# Patient Record
Sex: Male | Born: 1980 | Race: White | Hispanic: No | Marital: Married | Smoking: Current every day smoker
Health system: Southern US, Community
[De-identification: ages and names within clinical notes are randomized; demographics above are authoritative.]

---

## 2002-04-21 ENCOUNTER — Emergency Department (HOSPITAL_COMMUNITY): Admission: EM | Admit: 2002-04-21 | Discharge: 2002-04-21 | Payer: Self-pay | Admitting: *Deleted

## 2009-02-19 ENCOUNTER — Emergency Department (HOSPITAL_COMMUNITY): Admission: EM | Admit: 2009-02-19 | Discharge: 2009-02-19 | Payer: Self-pay | Admitting: Emergency Medicine

## 2009-02-22 ENCOUNTER — Emergency Department (HOSPITAL_COMMUNITY): Admission: EM | Admit: 2009-02-22 | Discharge: 2009-02-22 | Payer: Self-pay | Admitting: Emergency Medicine

## 2015-01-11 ENCOUNTER — Emergency Department (HOSPITAL_COMMUNITY): Payer: BLUE CROSS/BLUE SHIELD

## 2015-01-11 ENCOUNTER — Emergency Department (HOSPITAL_COMMUNITY)
Admission: EM | Admit: 2015-01-11 | Discharge: 2015-01-11 | Disposition: A | Discharge status: Home / Self Care | Payer: BLUE CROSS/BLUE SHIELD | Attending: Emergency Medicine | Admitting: Emergency Medicine

## 2015-01-11 ENCOUNTER — Encounter (HOSPITAL_COMMUNITY): Payer: Self-pay | Admitting: Family Medicine

## 2015-01-11 DIAGNOSIS — R079 Chest pain, unspecified: Secondary | ICD-10-CM | POA: Diagnosis present

## 2015-01-11 DIAGNOSIS — R05 Cough: Secondary | ICD-10-CM

## 2015-01-11 DIAGNOSIS — R0789 Other chest pain: Secondary | ICD-10-CM | POA: Diagnosis not present

## 2015-01-11 DIAGNOSIS — Z720 Tobacco use: Secondary | ICD-10-CM | POA: Insufficient documentation

## 2015-01-11 DIAGNOSIS — R059 Cough, unspecified: Secondary | ICD-10-CM

## 2015-01-11 LAB — CBC
HCT: 45.7 % (ref 39.0–52.0)
Hemoglobin: 15.6 g/dL (ref 13.0–17.0)
MCH: 30.2 pg (ref 26.0–34.0)
MCHC: 34.1 g/dL (ref 30.0–36.0)
MCV: 88.6 fL (ref 78.0–100.0)
PLATELETS: 262 10*3/uL (ref 150–400)
RBC: 5.16 MIL/uL (ref 4.22–5.81)
RDW: 13.2 % (ref 11.5–15.5)
WBC: 6.2 10*3/uL (ref 4.0–10.5)

## 2015-01-11 LAB — BASIC METABOLIC PANEL
ANION GAP: 7 (ref 5–15)
BUN: 20 mg/dL (ref 6–20)
CALCIUM: 9.6 mg/dL (ref 8.9–10.3)
CO2: 26 mmol/L (ref 22–32)
Chloride: 106 mmol/L (ref 101–111)
Creatinine, Ser: 1.41 mg/dL — ABNORMAL HIGH (ref 0.61–1.24)
GFR calc Af Amer: >60 mL/min (ref >60)
GFR calc non Af Amer: >60 mL/min (ref >60)
Glucose, Bld: 99 mg/dL (ref 65–99)
POTASSIUM: 4.3 mmol/L (ref 3.5–5.1)
SODIUM: 139 mmol/L (ref 135–145)

## 2015-01-11 LAB — I-STAT TROPONIN, ED
Troponin i, poc: 0.01 ng/mL (ref 0.00–0.08)
Troponin i, poc: 0.02 ng/mL (ref 0.00–0.08)

## 2015-01-11 MED ORDER — IPRATROPIUM BROMIDE 0.02 % IN SOLN
0.5000 mg | Freq: Once | RESPIRATORY_TRACT | Status: AC
  Administered 2015-01-11: 0.5 mg via RESPIRATORY_TRACT
  Filled 2015-01-11: qty 2.5

## 2015-01-11 MED ORDER — ALBUTEROL SULFATE HFA 108 (90 BASE) MCG/ACT IN AERS
2.0000 | INHALATION_SPRAY | RESPIRATORY_TRACT | Status: DC | PRN
Start: 2015-01-11 — End: 2015-01-11
  Administered 2015-01-11: 2 via RESPIRATORY_TRACT
  Filled 2015-01-11: qty 6.7

## 2015-01-11 MED ORDER — ALBUTEROL SULFATE (2.5 MG/3ML) 0.083% IN NEBU
5.0000 mg | INHALATION_SOLUTION | Freq: Once | RESPIRATORY_TRACT | Status: AC
  Administered 2015-01-11: 5 mg via RESPIRATORY_TRACT
  Filled 2015-01-11: qty 6

## 2015-01-11 MED ORDER — PREDNISONE 10 MG PO TABS: 20.0000 mg | ORAL_TABLET | Freq: Every day | ORAL | Status: AC

## 2015-01-11 NOTE — ED Provider Notes (Signed)
CSN: [id number]     Arrival date & time 01/11/15  1018 History   First MD Initiated Contact with Patient 01/11/15 1048     Chief Complaint  Patient presents with  . Chest Pain     (Consider location/radiation/quality/duration/timing/severity/associated sxs/prior Treatment) HPI    PCP: No primary care provider on file. Blood pressure 135/92, pulse 69, temperature 97.8 F (36.6 C), temperature source Oral, resp. rate 16, SpO2 98 %.  Ryota Treece is a 34 y.o.male without any significant PMH presents to the ER with complaints of chest pain. The patients pain started two weeks ago to the distal sternum and it has been constant. He describes it as tight. He reports while exerting himself this morning while at work and the pain got worse. He became concerned about this and decided he needed to get his pain evaluated. He did not have any other associated symptoms aside from what he described as fatigue. He had no nausea, vomiting, diaphoresis, or focal weakness. He has been at rest for over an hour and the pain persists in the same severity. The only thing he has found to make it worse is to put his chin to his chest. He is a heavy cigarette smoker and in November admits to significantly increasing the number of cigarettes that he smokes.  The patient denies having any symptoms of SOB, radiation of pain, abdominal pain, back pain, injury, hx of the same symptoms.   History reviewed. No pertinent past medical history. History reviewed. No pertinent past surgical history. History reviewed. No pertinent family history. History  Substance Use Topics  . Smoking status: Current Every Day Smoker -- 2.00 packs/day  . Smokeless tobacco: Not on file  . Alcohol Use: Yes    Review of Systems  10 Systems reviewed and are negative for acute change except as noted in the HPI.   Allergies  Review of patient's allergies indicates no known allergies.  Home Medications   Prior to Admission medications     Medication Sig Start Date End Date Taking? Authorizing Provider  predniSONE (DELTASONE) 10 MG tablet Take 2 tablets (20 mg total) by mouth daily. 01/11/15   Curtis Uriarte Neva Seat, PA-C   BP 131/95 mmHg  Pulse 69  Temp(Src) 97.8 F (36.6 C) (Oral)  Resp 14  SpO2 98% Physical Exam  Constitutional: He appears well-developed and well-nourished. No distress.  HENT:  Head: Normocephalic and atraumatic.  Eyes: Pupils are equal, round, and reactive to light.  Neck: Normal range of motion. Neck supple.  Cardiovascular: Normal rate and regular rhythm.   Pulmonary/Chest: Effort normal. He has decreased breath sounds (diffuse). He has no wheezes. He has no rhonchi. He exhibits no tenderness, no bony tenderness, no laceration and no crepitus.    Abdominal: Soft.  Musculoskeletal:  No lower extremity edema.  Neurological: He is alert.  Skin: Skin is warm and dry.  Nursing note and vitals reviewed.   ED Course  Procedures (including critical care time) Labs Review Labs Reviewed  BASIC METABOLIC PANEL - Abnormal; Notable for the following:    Creatinine, Ser 1.41 (*)    All other components within normal limits  CBC  I-STAT TROPOININ, ED  Rosezena Sensor, ED    Imaging Review Dg Chest 2 View  01/11/2015   CLINICAL DATA:  Chest pain for 2 weeks with worsening today  EXAM: CHEST  2 VIEW  COMPARISON:  None.  FINDINGS: Cardiomediastinal silhouette is unremarkable. No acute infiltrate or pleural effusion. No pulmonary edema. Bony thorax  is unremarkable.  IMPRESSION: No active cardiopulmonary disease.   Electronically Signed   By: Natasha Mead M.D.   On: 01/11/2015 10:48     EKG Interpretation None      MDM   Final diagnoses:  Chest tightness    Medications  albuterol (PROVENTIL HFA;VENTOLIN HFA) 108 (90 BASE) MCG/ACT inhaler 2 puff (not administered)  albuterol (PROVENTIL) (2.5 MG/3ML) 0.083% nebulizer solution 5 mg (5 mg Nebulization Given 01/11/15 1148)  ipratropium (ATROVENT)  nebulizer solution 0.5 mg (0.5 mg Nebulization Given 01/11/15 1148)  albuterol (PROVENTIL) (2.5 MG/3ML) 0.083% nebulizer solution 5 mg (5 mg Nebulization Given 01/11/15 1311)  ipratropium (ATROVENT) nebulizer solution 0.5 mg (0.5 mg Nebulization Given 01/11/15 1311)   The patient reports significant relief with the breathing treatments. He has been smoking significantly more often and reports spraying chemicals without wearing protection. Pt has been advised to stop smoking and to wear protective gear when smoking.  He has a Heart Score of 1 - two risk factors hypertension and smoking. Symptoms have been constant for two weeks, two negative Troponins- normal labs and normal chest xray.  Patient will need to see a PCP, he has BCBS and agrees to start seeing a PCP. Will be started on albuterol inhaler and steroid dosepack.  Medications  albuterol (PROVENTIL HFA;VENTOLIN HFA) 108 (90 BASE) MCG/ACT inhaler 2 puff (not administered)  albuterol (PROVENTIL) (2.5 MG/3ML) 0.083% nebulizer solution 5 mg (5 mg Nebulization Given 01/11/15 1148)  ipratropium (ATROVENT) nebulizer solution 0.5 mg (0.5 mg Nebulization Given 01/11/15 1148)  albuterol (PROVENTIL) (2.5 MG/3ML) 0.083% nebulizer solution 5 mg (5 mg Nebulization Given 01/11/15 1311)  ipratropium (ATROVENT) nebulizer solution 0.5 mg (0.5 mg Nebulization Given 01/11/15 1311)    34 y.o.Zakariya Killam's evaluation in the Emergency Department is complete. It has been determined that no acute conditions requiring further emergency intervention are present at this time. The patient/guardian have been advised of the diagnosis and plan. We have discussed signs and symptoms that warrant return to the ED, such as changes or worsening in symptoms.  Vital signs are stable at discharge. Filed Vitals:   01/11/15 1300  BP: 131/95  Pulse: 69  Temp:   Resp: 14    Patient/guardian has voiced understanding and agreed to follow-up with the PCP or  specialist.      Marlon Pel, PA-C 01/11/15 1357  Jerelyn Scott, MD 01/11/15 1359

## 2015-01-11 NOTE — Discharge Instructions (Signed)
Bronchospasm A bronchospasm is when the tubes that carry air in and out of your lungs (airways) spasm or tighten. During a bronchospasm it is hard to breathe. This is because the airways get smaller. A bronchospasm can be triggered by:  Allergies. These may be to animals, pollen, food, or mold.  Infection. This is a common cause of bronchospasm.  Exercise.  Irritants. These include pollution, cigarette smoke, strong odors, aerosol sprays, and paint fumes.  Weather changes.  Stress.  Being emotional. HOME CARE   Always have a plan for getting help. Know when to call your doctor and local emergency services (911 in the [place] Know where you can get emergency care.  Only take medicines as told by your doctor.  If you were prescribed an inhaler or nebulizer machine, ask your doctor how to use it correctly. Always use a spacer with your inhaler if you were given one.  Stay calm during an attack. Try to relax and breathe more slowly.  Control your home environment:  Change your heating and air conditioning filter at least once a month.  Limit your use of fireplaces and wood stoves.  Do not  smoke. Do not  allow smoking in your home.  Avoid perfumes and fragrances.  Get rid of pests (such as roaches and mice) and their droppings.  Throw away plants if you see mold on them.  Keep your house clean and dust free.  Replace carpet with wood, tile, or vinyl flooring. Carpet can trap dander and dust.  Use allergy-proof pillows, mattress covers, and box spring covers.  Wash bed sheets and blankets every week in hot water. Dry them in a dryer.  Use blankets that are made of polyester or cotton.  Wash hands frequently. GET HELP IF:  You have muscle aches.  You have chest pain.  The thick spit you spit or cough up (sputum) changes from clear or white to yellow, green, gray, or bloody.  The thick spit you spit or cough up gets thicker.  There are problems that may be related  to the medicine you are given such as:  A rash.  Itching.  Swelling.  Trouble breathing. GET HELP RIGHT AWAY IF:  You feel you cannot breathe or catch your breath.  You cannot stop coughing.  Your treatment is not helping you breathe better.  You have very bad chest pain. MAKE SURE YOU:   Understand these instructions.  Will watch your condition.  Will get help right away if you are not doing well or get worse. Document Released: 05/14/2009 Document Revised: 07/22/2013 Document Reviewed: 01/07/2013 ExitCare Patient Information 2015 [employer]. This information is not intended to replace advice given to you by your health care provider. Make sure you discuss any questions you have with your health care provider.  

## 2015-01-11 NOTE — ED Notes (Signed)
Pt here for chest pain x 2 weeks and worsening today. sts he was pushing a dump truck. sts also had a cough 1 month ago and cold.

## 2015-03-18 ENCOUNTER — Encounter (HOSPITAL_COMMUNITY): Payer: Self-pay | Admitting: Emergency Medicine

## 2015-03-18 ENCOUNTER — Emergency Department (INDEPENDENT_AMBULATORY_CARE_PROVIDER_SITE_OTHER)
Admission: EM | Admit: 2015-03-18 | Discharge: 2015-03-18 | Disposition: A | Discharge status: Home / Self Care | Payer: BLUE CROSS/BLUE SHIELD | Source: Home / Self Care | Attending: Family Medicine | Admitting: Family Medicine

## 2015-03-18 DIAGNOSIS — S60469A Insect bite (nonvenomous) of unspecified finger, initial encounter: Secondary | ICD-10-CM

## 2015-03-18 DIAGNOSIS — W57XXXA Bitten or stung by nonvenomous insect and other nonvenomous arthropods, initial encounter: Secondary | ICD-10-CM

## 2015-03-18 NOTE — ED Notes (Signed)
C/o insect bite on left index finger since yesterday  Noticed after working on car  States he thinks a [employer] forces logistics/support/administrative officer bite him Area does itch Did have clear discharge; now discharge is pus

## 2015-03-18 NOTE — Discharge Instructions (Signed)
Insect Bite °Mosquitoes, flies, fleas, bedbugs, and many other insects can bite. Insect bites are different from insect stings. A sting is when venom is injected into the skin. Some insect bites can transmit infectious diseases. °SYMPTOMS  °Insect bites usually turn red, swell, and itch for 2 to 4 days. They often go away on their own. °TREATMENT  °Your caregiver may prescribe antibiotic medicines if a bacterial infection develops in the bite. °HOME CARE INSTRUCTIONS °· Do not scratch the bite area. °· Keep the bite area clean and dry. Wash the bite area thoroughly with soap and water. °· Put ice or cool compresses on the bite area. °¨ Put ice in a plastic bag. °¨ Place a towel between your skin and the bag. °¨ Leave the ice on for 20 minutes, 4 times a day for the first 2 to 3 days, or as directed. °· You may apply a baking soda paste, cortisone cream, or calamine lotion to the bite area as directed by your caregiver. This can help reduce itching and swelling. °· Only take over-the-counter or prescription medicines as directed by your caregiver. °· If you are given antibiotics, take them as directed. Finish them even if you start to feel better. °You may need a tetanus shot if: °· You cannot remember when you had your last tetanus shot. °· You have never had a tetanus shot. °· The injury broke your skin. °If you get a tetanus shot, your arm may swell, get red, and feel warm to the touch. This is common and not a problem. If you need a tetanus shot and you choose not to have one, there is a rare chance of getting tetanus. Sickness from tetanus can be serious. °SEEK IMMEDIATE MEDICAL CARE IF:  °· You have increased pain, redness, or swelling in the bite area. °· You see a red line on the skin coming from the bite. °· You have a fever. °· You have joint pain. °· You have a headache or neck pain. °· You have unusual weakness. °· You have a rash. °· You have chest pain or shortness of breath. °· You have abdominal pain,  nausea, or vomiting. °· You feel unusually tired or sleepy. °MAKE SURE YOU:  °· Understand these instructions. °· Will watch your condition. °· Will get help right away if you are not doing well or get worse. °Document Released: 08/24/2004 Document Revised: 10/09/2011 Document Reviewed: 02/15/2011 °ExitCare® Patient Information ©2015 [employer]. This information is not intended to replace advice given to you by your health care provider. Make sure you discuss any questions you have with your health care provider. ° °Spider Bite °Spider bites are not common. Most spider bites do not cause serious problems. The elderly, very young children, and people with certain existing medical conditions are more likely to experience significant symptoms. °SYMPTOMS  °Spider bites may not cause any pain at first. Within 1 or 2 days of the bite, there may be swelling, redness, and pain in the bite area. However, some spider bites can cause pain within the first hour. °TREATMENT  °Your caregiver may prescribe antibiotic medicine if a bacterial infection develops in the bite. However, not all spider bites require antibiotics or prescription medicines.  °HOME CARE INSTRUCTIONS °· Do not scratch the bite area. °· Keep the bite area clean and dry. Wash the area with soap and water as directed. °· Put ice or cool compresses on the bite area. °¨ Put ice in a plastic bag. °¨ Place a   towel between your skin and the bag.  Leave the ice on for 20 minutes, 4 times a day for the first 2 to 3 days, or as directed.  Keep the bite area elevated above the level of your heart. This helps reduce redness and swelling.  Only take over-the-counter or prescription medicines as directed by your caregiver.  If you are given antibiotics, take them as directed. Finish them even if you start to feel better. You may need a tetanus shot if:  You cannot remember when you had your last tetanus shot.  You have never had a tetanus shot.  The injury  broke your skin. If you get a tetanus shot, your arm may swell, get red, and feel warm to the touch. This is common and not a problem. If you need a tetanus shot and you choose not to have one, there is a rare chance of getting tetanus. Sickness from tetanus can be serious. SEEK MEDICAL CARE IF: Your bite is not better after 3 days of treatment. SEEK IMMEDIATE MEDICAL CARE IF:  Your bite turns purple or develops increased swelling, pain, or redness.  You develop shortness of breath or chest pain.  You have muscle cramps or painful muscle spasms.  You develop abdominal pain, nausea, or vomiting.  You feel unusually tired or sleepy. MAKE SURE YOU:  Understand these instructions.  Will watch your condition.  Will get help right away if you are not doing well or get worse. Document Released: 08/24/2004 Document Revised: 10/09/2011 Document Reviewed: 02/15/2011 [facility] Patient Information 2015 [place], [place]. This information is not intended to replace advice given to you by your health care provider. Make sure you discuss any questions you have with your health care provider.

## 2015-03-18 NOTE — ED Provider Notes (Signed)
CSN: [id number]     Arrival date & time 03/18/15  1934 History   First MD Initiated Contact with Patient 03/18/15 2043     Chief Complaint  Patient presents with  . Insect Bite   (Consider location/radiation/quality/duration/timing/severity/associated sxs/prior Treatment) HPI Comments: 34 year old male states that he was bitten by what he believes to be a spider on the left index finger yesterday. Since then he has had a small area of erythema approximately 0.5 x 0.5 cm. It has been pruritic. There has been a few small blisterlike lesions that have popped with clear fluid in them. There is been no advancement of erythema. The edges of the lesions are well marginated. There is no current drainage, bleeding or exudate. No tenderness. The alleged by is located over the middle phalanx extensor surface.   History reviewed. No pertinent past medical history. History reviewed. No pertinent past surgical history. History reviewed. No pertinent family history. Social History  Substance Use Topics  . Smoking status: Current Every Day Smoker -- 2.00 packs/day  . Smokeless tobacco: None  . Alcohol Use: Yes    Review of Systems  Constitutional: Negative for fever and activity change.  HENT: Negative.   Respiratory: Negative.   Musculoskeletal: Negative.   Skin: Positive for wound. Negative for rash.  Neurological: Negative.     Allergies  Review of patient's allergies indicates no known allergies.  Home Medications   Prior to Admission medications   Medication Sig Start Date End Date Taking? Authorizing Provider  predniSONE (DELTASONE) 10 MG tablet Take 2 tablets (20 mg total) by mouth daily. 01/11/15   Tiffany Neva Seat, PA-C   BP 145/90 mmHg  Pulse 86  Temp(Src) 97.3 F (36.3 C) (Oral)  Resp 16  SpO2 97% Physical Exam  Constitutional: He is oriented to person, place, and time. He appears well-developed and well-nourished. No distress.  Eyes: Conjunctivae and EOM are normal.  Neck:  Normal range of motion. Neck supple.  Cardiovascular: Normal rate.   Pulmonary/Chest: Effort normal. No respiratory distress.  Musculoskeletal: Normal range of motion. He exhibits no edema.  Neurological: He is alert and oriented to person, place, and time.  Skin: Skin is warm and dry. No rash noted.  Description of the alleged bite to the left index finger is in history of present illness.  Psychiatric: He has a normal mood and affect.  Nursing note and vitals reviewed.   ED Course  Procedures (including critical care time) Labs Review Labs Reviewed - No data to display  Imaging Review No results found.   MDM   1. Insect bite of finger, initial encounter    Watch for infection. Keep clean and dry. For worsening new symptoms or problems may return. HC 1% prn    Hayden Rasmussen, NP 03/18/15 2100

## 2015-07-17 ENCOUNTER — Emergency Department (HOSPITAL_COMMUNITY)
Admission: EM | Admit: 2015-07-17 | Discharge: 2015-07-17 | Disposition: A | Discharge status: Home / Self Care | Payer: BLUE CROSS/BLUE SHIELD | Source: Home / Self Care | Attending: Emergency Medicine | Admitting: Emergency Medicine

## 2015-07-17 ENCOUNTER — Encounter (HOSPITAL_COMMUNITY): Payer: Self-pay | Admitting: Emergency Medicine

## 2015-07-17 DIAGNOSIS — L84 Corns and callosities: Secondary | ICD-10-CM

## 2015-07-17 NOTE — ED Provider Notes (Signed)
CSN: [id number]     Arrival date & time 07/17/15  1707 History   First MD Initiated Contact with Patient 07/17/15 1742     Chief Complaint  Patient presents with  . Cyst   (Consider location/radiation/quality/duration/timing/severity/associated sxs/prior Treatment) HPI Comments: 34 year old male presented to the urgent care with concern for 3 or 4 little knots to his fingers that started a few months ago. They are not painful. They do not calls a decrease in function. They are not tender. He was out Christmas shopping and decided while he was in town he would get them checked out. 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   History reviewed. No pertinent past medical history. History reviewed. No pertinent past surgical history. No family history on file. Social History  Substance Use Topics  . Smoking status: Current Every Day Smoker -- 2.00 packs/day  . Smokeless tobacco: None  . Alcohol Use: Yes    Review of Systems  Constitutional: Negative.   Respiratory: Negative.   Musculoskeletal: Negative.  Negative for joint swelling and arthralgias.  Skin:       As per history of present illness  Neurological: Negative.     Allergies  Review of patient's allergies indicates no known allergies.  Home Medications   Prior to Admission medications   Medication Sig Start Date End Date Taking? Authorizing Provider  predniSONE (DELTASONE) 10 MG tablet Take 2 tablets (20 mg total) by mouth daily. 01/11/15   Marlon Peliffany Greene, PA-C   Meds Ordered and Administered this Visit  Medications - No data to display  BP 160/91 mmHg  Pulse 87  Temp(Src) 98 F (36.7 C) (Oral)  Resp 18  SpO2 98% No data found.   Physical Exam  Constitutional: He appears well-developed and well-nourished. No distress.  Pulmonary/Chest: Effort normal. No respiratory distress.  Musculoskeletal:  There is a solitary 3 mm nodule within the dermis at the base  of the left thumb adjacent to the MCP, similar lesion to the right finger adjacent to the PIP and DIP. These are slightly mobile, minimal redness. Nontender. Not fluctuant. Firm. No surrounding erythema. No joint tenderness. Full movement of all digits, flexion and extension without pain or discomfort.  Neurological: He is alert. No cranial nerve deficit. He exhibits normal muscle tone.  Skin: Skin is warm and dry.  Nursing note and vitals reviewed.   ED Course  Procedures (including critical care time)  Labs Review Labs Reviewed - No data to display  Imaging Review No results found.   Visual Acuity Review  Right Eye Distance:   Left Eye Distance:   Bilateral Distance:    Right Eye Near:   Left Eye Near:    Bilateral Near:         MDM   1. Pre-ulcerative corn or callous    Reassurance. Treatment unnecessary at this time. Follow-up here PCP particularly if there is associated pain, increased redness or limitation in movement or function.    Hayden Rasmussenavid Wilfrid Hyser, NP 07/17/15 [phone]

## 2015-07-17 NOTE — Discharge Instructions (Signed)
Corns and Calluses Corns are small areas of thickened skin that occur on the top, sides, or tip of a toe. They contain a cone-shaped core with a point that can press on a nerve below. This causes pain. Calluses are areas of thickened skin that can occur anywhere on the body including hands, fingers, palms, soles of the feet, and heels.Calluses are usually larger than corns.  CAUSES  Corns and calluses are caused by rubbing (friction) or pressure, such as from shoes that are too tight or do not fit properly.  RISK FACTORS Corns are more likely to develop in people who have toe deformities, such as hammer toes. Since calluses can occur with friction to any area of the skin, calluses are more likely to develop in people who:   Work with their hands.  Wear shoes that fit poorly, shoes that are too tight, or shoes that are high-heeled.  Have toes deformities. SYMPTOMS Symptoms of a corn or callus include:  A hard growth on the skin.   Pain or tenderness under the skin.   Redness and swelling.   Increased discomfort while wearing tight-fitting shoes. DIAGNOSIS  Corns and calluses may be diagnosed with a medical history and physical exam.  TREATMENT  Corns and calluses may be treated with:  Removing the cause of the friction or pressure. This may include:  Changing your shoes.  Wearing shoe inserts (orthotics) or other protective layers in your shoes, such as a corn pad.  Wearing gloves.  Medicines to help soften skin in the hardened, thickened areas.  Reducing the size of the corn or callus by removing the dead layers of skin.  Antibiotic medicines to treat infection.  Surgery, if a toe deformity is the cause. HOME CARE INSTRUCTIONS   Take medicines only as directed by your health care provider.  If you were prescribed an antibiotic, finish all of it even if you start to feel better.  Wear shoes that fit well. Avoid wearing high-heeled shoes and shoes that are too tight  or too loose.  Wear any padding, protective layers, gloves, or orthotics as directed by your health care provider.  Soak your hands or feet and then use a file or pumice stone to soften your corn or callus. Do this as directed by your health care provider.  Check your corn or callus every day for signs of infection. Watch for:  Redness, swelling, or pain.  Fluid, blood, or pus. SEEK MEDICAL CARE IF:   Your symptoms do not improve with treatment.  You have increased redness, swelling, or pain at the site of your corn or callus.  You have fluid, blood, or pus coming from your corn or callus.  You have new symptoms.   This information is not intended to replace advice given to you by your health care provider. Make sure you discuss any questions you have with your health care provider.   Document Released: 04/22/2004 Document Revised: 12/01/2014 Document Reviewed: 07/13/2014 Elsevier Interactive Patient Education 2016 [employer].  

## 2015-07-17 NOTE — ED Notes (Signed)
Pt has mutl cysts/mass on right index and thumb and left index onset 1-3 months Denies pain... 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 No acute distress.

## 2018-04-04 ENCOUNTER — Ambulatory Visit (HOSPITAL_COMMUNITY)
Admission: EM | Admit: 2018-04-04 | Discharge: 2018-04-04 | Disposition: A | Discharge status: Home / Self Care | Payer: BLUE CROSS/BLUE SHIELD | Attending: Family Medicine | Admitting: Family Medicine

## 2018-04-04 ENCOUNTER — Other Ambulatory Visit: Payer: Self-pay

## 2018-04-04 ENCOUNTER — Encounter (HOSPITAL_COMMUNITY): Payer: Self-pay | Admitting: *Deleted

## 2018-04-04 DIAGNOSIS — L989 Disorder of the skin and subcutaneous tissue, unspecified: Secondary | ICD-10-CM

## 2018-04-04 NOTE — ED Triage Notes (Signed)
C/o spot on left forearm states he thinks it could be cancer.

## 2018-04-15 NOTE — ED Provider Notes (Signed)
  [facility]   [id number] 04/04/18 Arrival Time: 1759  ASSESSMENT & PLAN:  1. Skin lesion    I have no concern this looks like a melanoma. If he desires he may schedule f/u with a dermatologist for an evaluation with biopsy. Reassured. He agrees to watch this area closely.  Reviewed expectations re: course of current medical issues. Questions answered. Outlined signs and symptoms indicating need for more acute intervention. Patient verbalized understanding. After Visit Summary given.   SUBJECTIVE:  Joseph Haley is a [age] y.o. male who presents with an area on his L forearm that has been present for quite some time. Thinks is has slowly grown in size over the past few months. No pain. No bleeding. Has stayed the same color; slightly darker than surrounding skin. No injury to this area. No significant sun exposure reported. No self/home treatment.  ROS: As per HPI.  OBJECTIVE: Vitals:   04/04/18 1823  BP: (!) 152/90  Pulse: 84  Resp: 18  Temp: 98.2 F (36.8 C)  TempSrc: Oral  SpO2: 97%    General appearance: alert; no distress Lungs: clear to auscultation bilaterally Heart: regular rate and rhythm Extremities: no edema Skin: warm and dry; signs of infection: no; on L forearm there is an approximately 0.443mm circular flat area with mild erythema; no bleeding; blanches; non-tender Psychological: alert and cooperative; normal mood and affect  No Known Allergies   Social History   Socioeconomic History  . Marital status: Married    Spouse name: Not on file  . Number of children: Not on file  . Years of education: Not on file  . Highest education level: Not on file  Occupational History  . Not on file  Social Needs  . Financial resource strain: Not on file  . Food insecurity:    Worry: Not on file    Inability: Not on file  . Transportation needs:    Medical: Not on file    Non-medical: Not on file  Tobacco Use  . Smoking status: Current Every Day Smoker      Packs/day: 2.00  . Smokeless tobacco: Never Used  Substance and Sexual Activity  . Alcohol use: Yes  . Drug use: No  . Sexual activity: Not on file  Lifestyle  . Physical activity:    Days per week: Not on file    Minutes per session: Not on file  . Stress: Not on file  Relationships  . Social connections:    Talks on phone: Not on file    Gets together: Not on file    Attends religious service: Not on file    Active member of club or organization: Not on file    Attends meetings of clubs or organizations: Not on file    Relationship status: Not on file  . Intimate partner violence:    Fear of current or ex partner: Not on file    Emotionally abused: Not on file    Physically abused: Not on file    Forced sexual activity: Not on file  Other Topics Concern  . Not on file  Social History Narrative  . Not on file   No family history on file. History reviewed. No pertinent surgical history.   [name], [name], MD 04/17/18 1120

## 2019-10-18 ENCOUNTER — Ambulatory Visit: Payer: Self-pay | Attending: Internal Medicine

## 2019-10-18 DIAGNOSIS — Z23 Encounter for immunization: Secondary | ICD-10-CM

## 2019-10-18 NOTE — Progress Notes (Signed)
   Covid-19 Vaccination Clinic  Name:  Joseph Haley    MRN: [id number] DOB: 01/05/1981  10/18/2019  Joseph Haley was observed post Covid-19 immunization for 15 minutes without incident. He was provided with Vaccine Information Sheet and instruction to access the V-Safe system.   Joseph Haley was instructed to call 911 with any severe reactions post vaccine: Marland Kitchen Difficulty breathing  . Swelling of face and throat  . A fast heartbeat  . A bad rash all over body  . Dizziness and weakness   Immunizations Administered    Name Date Dose VIS Date Route   Moderna COVID-19 Vaccine 10/18/2019  8:06 AM 0.5 mL 07/01/2019 Intramuscular   Manufacturer: Moderna   Lot: [id number]   NDC: 74944-967-59

## 2019-11-19 ENCOUNTER — Ambulatory Visit: Payer: Self-pay | Attending: Internal Medicine

## 2019-11-19 DIAGNOSIS — Z23 Encounter for immunization: Secondary | ICD-10-CM

## 2019-11-19 NOTE — Progress Notes (Signed)
   Covid-19 Vaccination Clinic  Name:  Joseph Haley    MRN: [id number] DOB: 1981/06/30  11/19/2019  [name] was observed post Covid-19 immunization for 15 minutes without incident. He was provided with Vaccine Information Sheet and instruction to access the V-Safe system.   [name] was instructed to call 911 with any severe reactions post vaccine: Marland Kitchen Difficulty breathing  . Swelling of face and throat  . A fast heartbeat  . A bad rash all over body  . Dizziness and weakness   Immunizations Administered    Name Date Dose VIS Date Route   Moderna COVID-19 Vaccine 11/19/2019  8:28 AM 0.5 mL 07/2019 Intramuscular   Manufacturer: Moderna   Lot: [id number]   NDC: 39532-023-34

## 2020-02-07 ENCOUNTER — Emergency Department (HOSPITAL_COMMUNITY)
Admission: EM | Admit: 2020-02-07 | Discharge: 2020-02-07 | Disposition: A | Discharge status: Home / Self Care | Payer: BC Managed Care – PPO | Attending: Emergency Medicine | Admitting: Emergency Medicine

## 2020-02-07 ENCOUNTER — Emergency Department (HOSPITAL_COMMUNITY): Payer: BC Managed Care – PPO

## 2020-02-07 ENCOUNTER — Encounter (HOSPITAL_COMMUNITY): Payer: Self-pay

## 2020-02-07 ENCOUNTER — Other Ambulatory Visit: Payer: Self-pay

## 2020-02-07 DIAGNOSIS — S62639A Displaced fracture of distal phalanx of unspecified finger, initial encounter for closed fracture: Secondary | ICD-10-CM

## 2020-02-07 DIAGNOSIS — W293XXA Contact with powered garden and outdoor hand tools and machinery, initial encounter: Secondary | ICD-10-CM | POA: Insufficient documentation

## 2020-02-07 DIAGNOSIS — Y929 Unspecified place or not applicable: Secondary | ICD-10-CM | POA: Insufficient documentation

## 2020-02-07 DIAGNOSIS — Y93H2 Activity, gardening and landscaping: Secondary | ICD-10-CM | POA: Diagnosis not present

## 2020-02-07 DIAGNOSIS — S61311A Laceration without foreign body of left index finger with damage to nail, initial encounter: Secondary | ICD-10-CM

## 2020-02-07 DIAGNOSIS — Y998 Other external cause status: Secondary | ICD-10-CM | POA: Diagnosis not present

## 2020-02-07 DIAGNOSIS — Z23 Encounter for immunization: Secondary | ICD-10-CM | POA: Insufficient documentation

## 2020-02-07 DIAGNOSIS — S62631A Displaced fracture of distal phalanx of left index finger, initial encounter for closed fracture: Secondary | ICD-10-CM | POA: Insufficient documentation

## 2020-02-07 MED ORDER — TETANUS-DIPHTH-ACELL PERTUSSIS 5-2.5-18.5 LF-MCG/0.5 IM SUSP
0.5000 mL | Freq: Once | INTRAMUSCULAR | Status: AC
  Administered 2020-02-07: 0.5 mL via INTRAMUSCULAR
  Filled 2020-02-07: qty 0.5

## 2020-02-07 MED ORDER — BACITRACIN ZINC 500 UNIT/GM EX OINT: TOPICAL_OINTMENT | Freq: Two times a day (BID) | CUTANEOUS | Status: DC

## 2020-02-07 MED ORDER — BACITRACIN-NEOMYCIN-POLYMYXIN 400-5-5000 EX OINT: 1.0000 "application " | TOPICAL_OINTMENT | Freq: Two times a day (BID) | CUTANEOUS | 0 refills | Status: AC

## 2020-02-07 MED ORDER — CEFAZOLIN SODIUM-DEXTROSE 2-4 GM/100ML-% IV SOLN
2.0000 g | Freq: Once | INTRAVENOUS | Status: AC
  Administered 2020-02-07: 2 g via INTRAVENOUS
  Filled 2020-02-07: qty 100

## 2020-02-07 MED ORDER — CEPHALEXIN 500 MG PO CAPS: 500.0000 mg | ORAL_CAPSULE | Freq: Four times a day (QID) | ORAL | 0 refills | Status: AC

## 2020-02-07 MED ORDER — LIDOCAINE-EPINEPHRINE (PF) 2 %-1:200000 IJ SOLN
10.0000 mL | Freq: Once | INTRAMUSCULAR | Status: AC
  Administered 2020-02-07: 10 mL
  Filled 2020-02-07: qty 20

## 2020-02-07 NOTE — Discharge Instructions (Signed)
You are seen in the ER for the injury to your finger.  There is a laceration that was repaired.  Nail damage does not appear extensive, and did not need repair.  We also have a fracture because of the injury to your bone.  It is prudent that you take the antibiotics that I prescribed and keep the wound clean and dry.  Apply dressing twice a day.  Return to the ER if you start noticing increased redness, pain and swelling or pus drainage.  Expect a call from Dr. Carollee Massed clinic on Monday for a follow-up.  If you do not hear from them by Tuesday then call the number provided for an appointment in 5 days.

## 2020-02-07 NOTE — ED Triage Notes (Addendum)
Patient here with left hand index finger lac after cutting with hedge clippers today. Saline dressing applied at triage. Laceration through nailbed

## 2020-02-07 NOTE — ED Notes (Signed)
Pt verbalizes understanding of discharge instructions, ambulated out of ED in NAD

## 2020-02-07 NOTE — ED Provider Notes (Signed)
[facility] EMERGENCY DEPARTMENT Provider Note   CSN: [id number] Arrival date & time: 02/07/20  1304     History No chief complaint on file.   Joseph Haley is a 39 y.o. male.  HPI    39 y/o comes in with cc of laceration. Patient was doing a project in his home and cut his L index finger with hedge clippers. He is R handed. Denies numbness, tingling.  Injury occurred prior to ED arrival.  Patient is not up-to-date with his tetanus.  History reviewed. No pertinent past medical history.  There are no problems to display for this patient.   History reviewed. No pertinent surgical history.     No family history on file.  Social History   Tobacco Use  . Smoking status: Current Every Day Smoker    Packs/day: 2.00  . Smokeless tobacco: Never Used  Substance Use Topics  . Alcohol use: Yes  . Drug use: No    Home Medications Prior to Admission medications   Medication Sig Start Date End Date Taking? Authorizing Provider  predniSONE (DELTASONE) 10 MG tablet Take 2 tablets (20 mg total) by mouth daily. 01/11/15   Marlon Pel, PA-C    Allergies    Patient has no known allergies.  Review of Systems   Review of Systems  Constitutional: Positive for activity change.  Musculoskeletal: Positive for arthralgias and myalgias.  Allergic/Immunologic: Negative for immunocompromised state.  Neurological: Negative for numbness.    Physical Exam Updated Vital Signs BP (!) 157/107 (BP Location: Right Arm)   Pulse (!) 105   Temp 98.2 F (36.8 C) (Oral)   Resp 16   Ht 6\' 2"  (1.88 m)   Wt 113.4 kg   SpO2 97%   BMI 32.10 kg/m   Physical Exam Vitals and nursing note reviewed.  Constitutional:      Appearance: He is well-developed.  HENT:     Head: Atraumatic.  Cardiovascular:     Rate and Rhythm: Normal rate.  Pulmonary:     Effort: Pulmonary effort is normal.  Musculoskeletal:     Cervical back: Neck supple.     Comments: Patient has a  laceration distal to the DIP joint over the left index finger.  The laceration starts over the pulp on the palmar surface at midline.  It extends into the nail going over the nail folds.  There is no avulsion of the nail.  Proximal aspect of the nail is intact and stable no active bleeding.  No evidence of subungual hematoma  Skin:    General: Skin is warm.  Neurological:     Mental Status: He is alert and oriented to person, place, and time.     Comments: Gross sensory exam reveals no numbness.  Patient is able to flex and extend over the DIP     ED Results / Procedures / Treatments   Labs (all labs ordered are listed, but only abnormal results are displayed) Labs Reviewed - No data to display  EKG None  Radiology DG Finger Index Left  Result Date: 02/07/2020 CLINICAL DATA:  LEFT index finger injury and pain. Initial encounter. EXAM: LEFT INDEX FINGER 2+V COMPARISON:  None. FINDINGS: A nondisplaced tuft fracture is noted. No subluxation or dislocation identified. No radiopaque foreign bodies are present. IMPRESSION: Nondisplaced tuft fracture.  No radiopaque foreign body. Electronically Signed   By: 04/09/2020 M.D.   On: 02/07/2020 15:59    Procedures .Nerve Block  Date/Time: 02/07/2020 5:43 PM Performed  by: Derwood Kaplan, MD Authorized by: Derwood Kaplan, MD   Consent:    Consent obtained:  Verbal   Consent given by:  Patient   Risks discussed:  Nerve damage, infection, swelling, unsuccessful block and bleeding   Alternatives discussed:  No treatment Universal protocol:    Procedure explained and questions answered to patient or proxy's satisfaction: yes     Patient identity confirmed:  Arm band Indications:    Indications:  Procedural anesthesia Location:    Body area:  Upper extremity   Upper extremity nerve blocked: Digital.   Laterality:  Left Pre-procedure details:    Skin preparation:  2% chlorhexidine   Preparation: Patient was prepped and draped in usual  sterile fashion   Procedure details (see MAR for exact dosages):    Block needle gauge:  25 G   Anesthetic injected:  Lidocaine 2% WITH epi   Injection procedure:  Anatomic landmarks identified Post-procedure details:    Dressing:  Sterile dressing   Outcome:  Anesthesia achieved   Patient tolerance of procedure:  Tolerated well, no immediate complications .Marland KitchenLaceration Repair  Date/Time: 02/07/2020 5:44 PM Performed by: Derwood Kaplan, MD Authorized by: Derwood Kaplan, MD   Consent:    Consent obtained:  Written and verbal   Consent given by:  Patient   Risks discussed:  Infection, pain, poor cosmetic result, need for additional repair, nerve damage and poor wound healing   Alternatives discussed:  No treatment Universal protocol:    Procedure explained and questions answered to patient or proxy's satisfaction: yes     Patient identity confirmed:  Arm band Anesthesia (see MAR for exact dosages):    Anesthesia method:  Nerve block Laceration details:    Location:  Finger   Finger location:  L index finger   Length (cm):  2   Depth (mm):  5 Repair type:    Repair type:  Complex Pre-procedure details:    Preparation:  Patient was prepped and draped in usual sterile fashion Exploration:    Wound exploration: wound explored through full range of motion     Wound extent: underlying fracture     Wound extent: no nerve damage noted, no tendon damage noted and no vascular damage noted     Contaminated: yes   Treatment:    Area cleansed with:  Soap and water and saline   Amount of cleaning:  Extensive   Irrigation solution:  Sterile water and tap water   Irrigation volume:  50   Irrigation method:  Pressure wash, syringe and tap   Debridement:  None   Undermining:  None   Scar revision: no   Skin repair:    Repair method:  Sutures   Suture size:  4-0   Wound skin closure material used: Vicryl.   Suture technique:  Simple interrupted   Number of sutures:  4 Approximation:      Approximation:  Close Post-procedure details:    Dressing:  Antibiotic ointment, non-adherent dressing and sterile dressing   Patient tolerance of procedure:  Tolerated well, no immediate complications   (including critical care time)  Medications Ordered in ED Medications  bacitracin ointment (has no administration in time range)  Tdap (BOOSTRIX) injection 0.5 mL (0.5 mLs Intramuscular Given 02/07/20 1542)  lidocaine-EPINEPHrine (XYLOCAINE W/EPI) 2 %-1:200000 (PF) injection 10 mL (10 mLs Infiltration Given 02/07/20 1542)  ceFAZolin (ANCEF) IVPB 2g/100 mL premix (0 g Intravenous Stopped 02/07/20 1611)    ED Course  I have reviewed the triage vital signs  and the nursing notes.  Pertinent labs & imaging results that were available during my care of the patient were reviewed by me and considered in my medical decision making (see chart for details).  Clinical Course as of Feb 06 1745  Sat Feb 07, 2020  1545 DG Finger Index Left [MS]  1631 DG Finger Index Left [MS]    Clinical Course User Index [MS] Elmer Bales   MDM Rules/Calculators/A&P                          Patient comes in a chief complaint of finger injury.  He has distal tuft fracture.  There is a laceration over the nail, however the nailbed does not appear to be compromised.  The integrity of the nail matrix also appears to be intact.  Discussed case with Dr. Janee Morn.  Based on her description he agrees with the plan of approximating the laceration without removing the nail from nail bed injury evaluation.  Patient will be updated on tetanus and given Ancef.  He is can go home with Keflex, bacitracin topical ointmen.  Dr. Carollee Massed clinic will set up an appointment.  Final Clinical Impression(s) / ED Diagnoses Final diagnoses:  Closed fracture of tuft of distal phalanx of finger  Laceration of left index finger without foreign body with damage to nail, initial encounter    Rx / DC Orders ED  Discharge Orders    None       Derwood Kaplan, MD 02/07/20 1747

## 2021-01-06 IMAGING — DX DG FINGER INDEX 2+V*L*
2 series · 2 of 2 positions shown · non-contrast
Comparison: None.

CLINICAL DATA: LEFT index finger injury and pain. Initial
encounter.

EXAM:
LEFT INDEX FINGER 2+V

[finger ap]
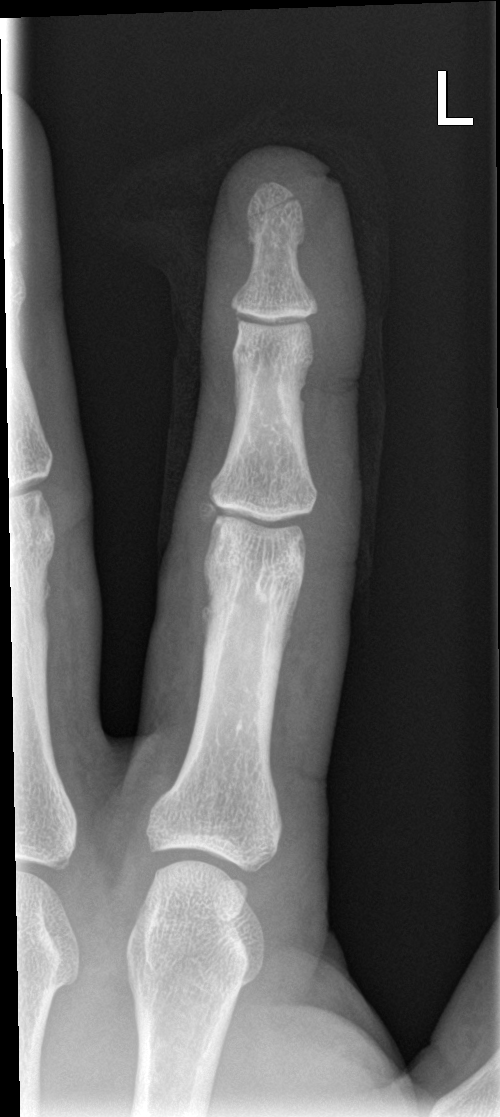

[finger lat]
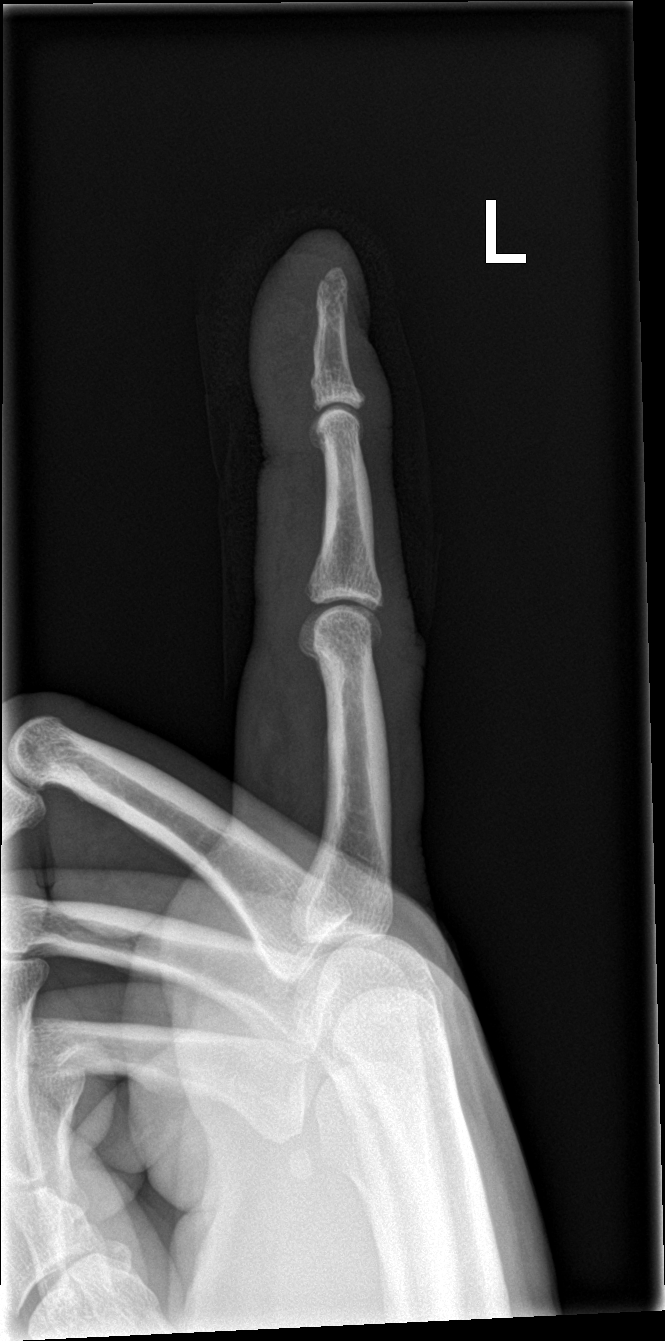

[2 of 2 positions shown; findings below may reference images not displayed]

FINDINGS: A nondisplaced tuft fracture is noted.

No subluxation or dislocation identified.

No radiopaque foreign bodies are present.
IMPRESSION: Nondisplaced tuft fracture.  No radiopaque foreign body.
